# Patient Record
Sex: Female | Born: 1965 | Race: White | Hispanic: No | Marital: Married | Smoking: Never smoker
Health system: Southern US, Community
[De-identification: ages and names within clinical notes are randomized; demographics above are authoritative.]

## PROBLEM LIST (undated history)

## (undated) DIAGNOSIS — K760 Fatty (change of) liver, not elsewhere classified: Secondary | ICD-10-CM

## (undated) DIAGNOSIS — J302 Other seasonal allergic rhinitis: Secondary | ICD-10-CM

## (undated) DIAGNOSIS — R011 Cardiac murmur, unspecified: Secondary | ICD-10-CM

## (undated) DIAGNOSIS — D126 Benign neoplasm of colon, unspecified: Secondary | ICD-10-CM

## (undated) HISTORY — DX: Cardiac murmur, unspecified: R01.1

## (undated) HISTORY — DX: Fatty (change of) liver, not elsewhere classified: K76.0

## (undated) HISTORY — PX: WISDOM TOOTH EXTRACTION: SHX21

## (undated) HISTORY — DX: Benign neoplasm of colon, unspecified: D12.6

## (undated) HISTORY — PX: TONSILLECTOMY: SUR1361

## (undated) HISTORY — DX: Other seasonal allergic rhinitis: J30.2

---

## 1999-03-02 ENCOUNTER — Other Ambulatory Visit: Admission: RE | Admit: 1999-03-02 | Discharge: 1999-03-02 | Payer: Self-pay | Admitting: *Deleted

## 2000-04-11 ENCOUNTER — Other Ambulatory Visit: Admission: RE | Admit: 2000-04-11 | Discharge: 2000-04-11 | Payer: Self-pay | Admitting: Obstetrics and Gynecology

## 2004-06-04 ENCOUNTER — Other Ambulatory Visit: Admission: RE | Admit: 2004-06-04 | Discharge: 2004-06-04 | Payer: Self-pay | Admitting: Obstetrics and Gynecology

## 2011-01-13 ENCOUNTER — Ambulatory Visit: Payer: Self-pay | Admitting: Internal Medicine

## 2016-09-03 ENCOUNTER — Other Ambulatory Visit: Payer: Self-pay | Admitting: Nurse Practitioner

## 2016-09-03 DIAGNOSIS — R1011 Right upper quadrant pain: Secondary | ICD-10-CM

## 2016-09-07 ENCOUNTER — Ambulatory Visit: Payer: Self-pay

## 2016-09-09 ENCOUNTER — Ambulatory Visit
Admission: RE | Admit: 2016-09-09 | Discharge: 2016-09-09 | Disposition: A | Discharge status: Home / Self Care | Payer: BC Managed Care – PPO | Source: Ambulatory Visit | Attending: Nurse Practitioner | Admitting: Nurse Practitioner

## 2016-09-09 DIAGNOSIS — R1011 Right upper quadrant pain: Secondary | ICD-10-CM | POA: Insufficient documentation

## 2016-09-09 DIAGNOSIS — R932 Abnormal findings on diagnostic imaging of liver and biliary tract: Secondary | ICD-10-CM | POA: Diagnosis not present

## 2016-09-20 ENCOUNTER — Other Ambulatory Visit: Payer: Self-pay | Admitting: Nurse Practitioner

## 2016-09-20 DIAGNOSIS — R1011 Right upper quadrant pain: Secondary | ICD-10-CM

## 2016-09-21 ENCOUNTER — Ambulatory Visit
Admission: RE | Admit: 2016-09-21 | Discharge: 2016-09-21 | Disposition: A | Discharge status: Home / Self Care | Payer: BC Managed Care – PPO | Source: Ambulatory Visit | Attending: Nurse Practitioner | Admitting: Nurse Practitioner

## 2016-09-21 DIAGNOSIS — Z975 Presence of (intrauterine) contraceptive device: Secondary | ICD-10-CM | POA: Insufficient documentation

## 2016-09-21 DIAGNOSIS — K5730 Diverticulosis of large intestine without perforation or abscess without bleeding: Secondary | ICD-10-CM | POA: Diagnosis not present

## 2016-09-21 DIAGNOSIS — R1011 Right upper quadrant pain: Secondary | ICD-10-CM | POA: Diagnosis not present

## 2016-09-21 DIAGNOSIS — K760 Fatty (change of) liver, not elsewhere classified: Secondary | ICD-10-CM | POA: Diagnosis not present

## 2016-09-21 MED ORDER — IOPAMIDOL (ISOVUE-300) INJECTION 61%
100.0000 mL | Freq: Once | INTRAVENOUS | Status: AC | PRN
  Administered 2016-09-21: 100 mL via INTRAVENOUS

## 2016-09-23 ENCOUNTER — Other Ambulatory Visit: Payer: Self-pay | Admitting: Adult Health

## 2016-09-23 DIAGNOSIS — R1011 Right upper quadrant pain: Secondary | ICD-10-CM

## 2016-09-30 ENCOUNTER — Ambulatory Visit: Payer: BC Managed Care – PPO

## 2016-09-30 ENCOUNTER — Encounter (HOSPITAL_COMMUNITY)
Admission: RE | Admit: 2016-09-30 | Discharge: 2016-09-30 | Disposition: A | Discharge status: Home / Self Care | Payer: BC Managed Care – PPO | Source: Ambulatory Visit | Attending: Adult Health | Admitting: Adult Health

## 2016-09-30 DIAGNOSIS — R1011 Right upper quadrant pain: Secondary | ICD-10-CM | POA: Diagnosis present

## 2016-09-30 MED ORDER — TECHNETIUM TC 99M MEBROFENIN IV KIT
5.3200 | PACK | Freq: Once | INTRAVENOUS | Status: AC | PRN
  Administered 2016-09-30: 5.32 via INTRAVENOUS

## 2016-10-05 ENCOUNTER — Inpatient Hospital Stay: Payer: BC Managed Care – PPO | Attending: Internal Medicine | Admitting: Internal Medicine

## 2016-10-05 ENCOUNTER — Encounter (INDEPENDENT_AMBULATORY_CARE_PROVIDER_SITE_OTHER): Payer: Self-pay

## 2016-10-05 DIAGNOSIS — K760 Fatty (change of) liver, not elsewhere classified: Secondary | ICD-10-CM | POA: Insufficient documentation

## 2016-10-05 DIAGNOSIS — R011 Cardiac murmur, unspecified: Secondary | ICD-10-CM | POA: Insufficient documentation

## 2016-10-05 DIAGNOSIS — D803 Selective deficiency of immunoglobulin G [IgG] subclasses: Secondary | ICD-10-CM | POA: Insufficient documentation

## 2016-10-05 DIAGNOSIS — D122 Benign neoplasm of ascending colon: Secondary | ICD-10-CM | POA: Diagnosis not present

## 2016-10-05 DIAGNOSIS — Z86010 Personal history of colonic polyps: Secondary | ICD-10-CM | POA: Insufficient documentation

## 2016-10-06 NOTE — Progress Notes (Signed)
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 She recently underwent a screening colonoscopy approximately 6 weeks ago. At that time she was noted to have polyps that were removed and were reported to her as benign. The day after colonoscopy she began having right upper quadrant pain she reports the pain to be a dulll achiness  on a scale off 2-3 out of 10. She reported this to Dr. Vira Agar she was evaluated by NP [facility]. She had an ultrasound of the gallbladder and liver showed no biliary ductal dilatation, no gall stones, no suspicious lesions in the liver but there was fatty infiltration  Of liver. CT scan with contrast of the abdomen revealed no othrt abnormalities. Hepatobiliary scintigraphy was normal with normal gallbladder ejection fraction.  She was incidentally also noted to have a transient elevation of the ALT within normal AST and alkaline phosphatase and normal bilirubin. This prompted workup for hepatitis which included hepatitis C and B panel which were reported negative. A serum protein electrophoresis was also ordered which revealed no monoclonal protein however her serum IgG G level was slightly below normal and IgA level was also reported slightly below normal. Prompted referral to hematology. Patient reports no history of frequent infections has bronchitis urinary tract infections or diarrhea. She's been relatively healthy denies any skin rashes no history of jaundice. No history of alcoholism. Denies any bone pains. The right upper quadrant pain does appear to get worse with sharp movements. She denies any nausea or vomiting melena hematochezia or change in her  bowel habits since colonoscopy. She denies any weight loss. All other systems review is negative at this time.    Results review ( External and Internal):  On 09/20/2016 white cell count was 6.2 hemoglobin 14.2 platelets 289 BUN 18 creatinine 0.8 calcium was 10.7 AST 19 ALT was also 19 with a previous value was reported as 51 on 09/03/2016 albumin was 4.6 alkaline phosphatase 55 total protein was 7.1 total bili was 0.8 a by G ratio was normal at 1.8 lipase was normal beta-hCG was negative C-reactive protein was normal at 2.5 sedimentation rate was 15 random urine electrophoresis was normal and serum A versus showed a total IgG at 675 on 09/13/2016 it was 758 on 09/03/2016 IgM was 227 slightly about normal on 09/13/2016. IgA level was normal at 165. The M spike was not detected IgE levels were normal. Hepatitis C antibody negative hepatitis B surface antigen negative hepatitis a IgM and total antibody was negative hepatitis B core antibody was negative. Serum iron was 110 total iron-binding capacity was 420 was 300 saturation was 25%      LAB RESULTS:  No results found for: NA, K, CL, CO2, GLUCOSE, BUN, CREATININE, CALCIUM, PROT, ALBUMIN, AST, ALT, ALKPHOS, BILITOT, GFRNONAA, GFRAA  No results found for: WBC, NEUTROABS, HGB, HCT, MCV, PLT   STUDIES: US Abdomen Complete  Result Date: 09/09/2016 CLINICAL DATA:  Right upper quadrant pain . EXAM: ABDOMEN ULTRASOUND COMPLETE COMPARISON:  No prior . FINDINGS: Gallbladder: No gallstones or wall thickening visualized. No sonographic Murphy sign noted by sonographer. Common bile duct: Diameter: 4 mm Liver: Liver is echogenic consistent fatty infiltration and/or hepatocellular disease. No focal hepatic abnormality  identified. IVC: No abnormality visualized. Pancreas: Visualized portion unremarkable. Spleen: Size and appearance within normal limits. Right Kidney: Length: 10.9 cm. Echogenicity within normal limits. No mass or hydronephrosis visualized.  Left Kidney: Length: 10.6 cm. Echogenicity within normal limits. No mass or hydronephrosis visualized. Abdominal aorta: No aneurysm visualized. Other findings: None. IMPRESSION: 1. Liver is echogenic consistent fatty infiltration and/or hepatocellular disease. No focal hepatic abnormality identified. 2. Exam otherwise unremarkable. Electronically Signed   By: Marcello Moores  Register   On: 09/09/2016 12:41   Ct Abdomen Pelvis W Contrast  Result Date: 09/21/2016 CLINICAL DATA:  Right abdominal pain for 1 month falling colonoscopy. Endometriosis. IUD. EXAM: CT ABDOMEN AND PELVIS WITH CONTRAST TECHNIQUE: Multidetector CT imaging of the abdomen and pelvis was performed using the standard protocol following bolus administration of intravenous contrast. CONTRAST:  179mL ISOVUE-300 IOPAMIDOL (ISOVUE-300) INJECTION 61% COMPARISON:  None. FINDINGS: Lower Chest: No acute findings. Hepatobiliary: No mass identified. Mild hepatic steatosis. Gallbladder is unremarkable. Pancreas:  No mass or inflammatory changes. Spleen: Within normal limits in size and appearance. Adrenals/Urinary Tract: No masses identified. No evidence of hydronephrosis. Stomach/Bowel: No evidence of obstruction, inflammatory process or abnormal fluid collections. No evidence of abnormal bowel wall thickening or free intraperitoneal air. Normal appendix visualized. Diverticulosis is seen involving the sigmoid colon, however there is no evidence of diverticulitis. Vascular/Lymphatic: No pathologically enlarged lymph nodes. No abdominal aortic aneurysm. Reproductive: IUD seen in expected location in the uterus. Uterus and adnexal regions are otherwise unremarkable. No mass, inflammatory process, or abnormal fluid collections identified. Other:  None. Musculoskeletal:  No suspicious bone lesions identified. IMPRESSION: No acute findings within the abdomen or pelvis. Mild hepatic steatosis. Colonic diverticulosis. No radiographic evidence of diverticulitis. IUD in  appropriate position. Electronically Signed   By: Earle Gell M.D.   On: 09/21/2016 11:57   Nm Hepato W/eject Fract  Result Date: 09/30/2016 CLINICAL DATA:  50 y/o F; right upper quadrant pain since colonoscopy 08/19/2016. EXAM: NUCLEAR MEDICINE HEPATOBILIARY IMAGING WITH GALLBLADDER EF TECHNIQUE: Sequential images of the abdomen were obtained out to 60 minutes following intravenous administration of radiopharmaceutical. After oral ingestion of Ensure, gallbladder ejection fraction was determined. At 60 min, normal ejection fraction is greater than 33%. RADIOPHARMACEUTICALS:  5.32 mCi Tc-98m  Choletec IV COMPARISON:  09/21/2016 CT abdomen and pelvis and 09/09/2016 abdominal sonogram. FINDINGS: Prompt uptake and biliary excretion of activity by the liver is seen. Gallbladder activity is visualized, consistent with patency of cystic duct. Biliary activity passes into small bowel, consistent with patent common bile duct. Calculated gallbladder ejection fraction is 63%. (Normal gallbladder ejection fraction with Ensure is greater than 33%.) Patient reports slight pain after consumption of Ensure. IMPRESSION: Normal hepatobiliary scintigraphy and normal gallbladder ejection fraction. Electronically Signed   By: Kristine Garbe M.D.   On: 09/30/2016 13:39     REVIEW OF SYSTEMS:   Review of Systems  Constitutional: Negative.   HENT: Negative.   Eyes: Negative.   Cardiovascular: Negative.   Gastrointestinal: Positive for abdominal pain.  Genitourinary: Negative.   Musculoskeletal: Negative.   Skin: Negative.   Neurological: Negative.   Endo/Heme/Allergies: Negative.   Psychiatric/Behavioral: Negative.   All other systems reviewed and are negative.   As per HPI. Otherwise, a complete review of systems is negative.  PAST MEDICAL HISTORY: Past Medical History:  Diagnosis Date  . Fatty liver   . Heart murmur   . Seasonal allergies     PAST SURGICAL HISTORY: History reviewed. No  pertinent surgical history.  FAMILY HISTORY:  Family History  Problem Relation Age of Onset  . Cancer Maternal Grandmother   . Cancer Paternal Grandmother   . Cancer Paternal Grandfather     No Known Allergies  Vitals:   10/05/16 1547  BP: 135/81  Pulse: 78  Temp: (!) 94.5 F (34.7 C)     There is no height or weight on file to calculate BMI.   There is no height or weight on file to calculate BSA.    Physical Exam  Constitutional: She appears well-developed and well-nourished.  HENT:  Head: Normocephalic and atraumatic.  Eyes: EOM are normal. Pupils are equal, round, and reactive to light.  Neck: Normal range of motion. Neck supple.  Cardiovascular: Normal rate and regular rhythm.   Pulmonary/Chest: Effort normal.  Abdominal: Soft. Bowel sounds are normal. There is tenderness (Tenderness elicited on deep palpation in the right upper quadrant but no palpable masses or hepatomegaly or splenomegaly).  Lymphadenopathy:    She has no cervical adenopathy.  Nursing note and vitals reviewed.   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 This is clinically of no significance to her since this abnormality  is the not contributing to any frequent infections etc. She does have some fatty infiltration of the liver that can explain mild elevation of ALT. There is no monoclonal gammopathy. No hematologic or oncologic intervention is needed at this time.  With regard to the persistent right upper quadrant pain, it clearly appears to have begun the day after colonoscopy  One of the polyps that was removed was in the proximal ascending colon. I reviewed his CT scans ultrasound ports there appears to be no signs of perforation diverticulitis or intra-abdominal bleeding. She will contact her gastroenterologist for further workup. She may benefit from a surgical consultation. No return visit here is necessary.     Patient expressed understanding and was in  agreement with this plan. She also understands that She can call clinic at any time with any questions, concerns, or complaints.       This note was generated in part with voice recognition software and I apologize for any typographical errors that were not detected and corrected.    Creola Corn, MD   10/06/2016 1:49 PM

## 2016-10-11 ENCOUNTER — Telehealth: Payer: Self-pay | Admitting: Gastroenterology

## 2016-10-11 NOTE — Telephone Encounter (Signed)
Patient is requesting to see Dr. Fuller Plan for 2nd opinion on RUQ pain. Records placed on Dr. Lynne Leader desk for review.

## 2016-10-19 ENCOUNTER — Encounter: Payer: Self-pay | Admitting: Gastroenterology

## 2016-10-19 NOTE — Telephone Encounter (Signed)
Dr. Fuller Plan reviewed records and has accepted patient. Ok to schedule OV. Left message for patient to return my call.

## 2016-12-07 ENCOUNTER — Ambulatory Visit (INDEPENDENT_AMBULATORY_CARE_PROVIDER_SITE_OTHER): Payer: BC Managed Care – PPO | Admitting: Gastroenterology

## 2016-12-07 ENCOUNTER — Encounter (INDEPENDENT_AMBULATORY_CARE_PROVIDER_SITE_OTHER): Payer: Self-pay

## 2016-12-07 ENCOUNTER — Encounter: Payer: Self-pay | Admitting: Gastroenterology

## 2016-12-07 VITALS — BP 120/80 | HR 68 | Ht 61.0 in | Wt 194.8 lb

## 2016-12-07 DIAGNOSIS — K760 Fatty (change of) liver, not elsewhere classified: Secondary | ICD-10-CM

## 2016-12-07 DIAGNOSIS — R1011 Right upper quadrant pain: Secondary | ICD-10-CM

## 2016-12-07 DIAGNOSIS — R0781 Pleurodynia: Secondary | ICD-10-CM | POA: Diagnosis not present

## 2016-12-07 DIAGNOSIS — Z86010 Personal history of colonic polyps: Secondary | ICD-10-CM

## 2016-12-07 NOTE — Patient Instructions (Addendum)
Take Tylenol and Advil as directed on packaging x 2-3 weeks.  Call back if your symptoms persist.     Thank you for choosing me and [facility].  Pricilla Riffle. Dagoberto Ligas., MD., Marval Regal

## 2016-12-07 NOTE — Progress Notes (Signed)
History of Present Illness: This is a 51 year old female referred by Maryland Pink, MD for the evaluation of RUQ pain/right costal margin pain. Pt recently evaluated by Dr. Vira Agar. I received and reviewed records. Colonoscopy for routine CRC screening in 07/2016 showed 2 small polyps (adenomatous by patient report). Following her colonoscopy she developed focal right costal margin/right upper quadrant pain that occasionally radiates to her back. Her symptoms are exacerbated by sitting and improve with standing. Her symptoms do not change with meals or bowel movements. She has mild reflux symptoms which are improved with omeprazole. Dicyclomine was tried with no impact on her right sided pain. Imaging studies outlined below. Denies weight loss, constipation, diarrhea, change in stool caliber, melena, hematochezia, nausea, vomiting, dysphagia.   Abd/pelvic CT 08/2016 fatty liver, colonic diverticulosis, IUD.  Abd [place] 08/2016 fatty liver.  HIDA 63% EF, normal  No Known Allergies Outpatient Medications Prior to Visit  Medication Sig Dispense Refill  . Omeprazole 20 MG TBEC Take 20 mg by mouth daily.    Marland Kitchen dicyclomine (BENTYL) 10 MG capsule Take by mouth.     No facility-administered medications prior to visit.    Past Medical History:  Diagnosis Date  . Adenomatous colon polyp 07/2021  . Fatty liver   . Heart murmur   . Seasonal allergies    Past Surgical History:  Procedure Laterality Date  . CESAREAN SECTION    . TONSILLECTOMY    . WISDOM TOOTH EXTRACTION     Social History   Social History  . Marital status: Married    Spouse name: N/A  . Number of children: N/A  . Years of education: N/A   Social History Main Topics  . Smoking status: Never Smoker  . Smokeless tobacco: Never Used  . Alcohol use 1.8 oz/week    3 Cans of beer per week     Comment: social drinker  . Drug use: No  . Sexual activity: Yes   Other Topics Concern  . None   Social History Narrative  . None    Family History  Problem Relation Age of Onset  . Cancer Maternal Grandmother   . Heart attack Maternal Grandmother   . Thyroid disease Maternal Grandmother   . Cancer Paternal Grandmother   . Cancer Paternal Grandfather   . Colon polyps Sister   . Diabetes type II Maternal Grandfather       Review of Systems: Pertinent positive and negative review of systems were noted in the above HPI section. All other review of systems were otherwise negative.    Physical Exam: General: Well developed, well nourished, no acute distress Head: Normocephalic and atraumatic Eyes:  sclerae anicteric, EOMI Ears: Normal auditory acuity Mouth: No deformity or lesions Neck: Supple, no masses or thyromegaly Lungs: Clear throughout to auscultation Heart: Regular rate and rhythm; no murmurs, rubs or bruits Chest: focal tenderness on right costal margin Abdomen: Soft, mild focal RUQ tenderness and non distended. No masses, hepatosplenomegaly or hernias noted. Normal Bowel sounds Rectal: deferred Musculoskeletal: Symmetrical with no gross deformities  Skin: No lesions on visible extremities Pulses:  Normal pulses noted Extremities: No clubbing, cyanosis, edema or deformities noted Neurological: Alert oriented x 4, grossly nonfocal Cervical Nodes:  No significant cervical adenopathy Inguinal Nodes: No significant inguinal adenopathy Psychological:  Alert and cooperative. Normal mood and affect  Assessment and Recommendations:  1. Right costal margin/RUQ pain, appears to be musculoskeletal. Pain occasionally radiates to her back. Patient was advised to try Tylenol  1-2 tid and Advil 400-600 mg tid for the next 2-3 weeks on a regular basis for symptom management and if symptoms do not improve to seek further care from her primary physician. Consider EGD if symptoms remain refractory although I feel her symptoms are not due to a gastrointestinal cause and EGD would be low yield.  2. Personal history of  adenomatous colon polyps. Five-year interval colonoscopy is recommended in September 2022.  3. Fatty liver. Long-term low fat, carb modified. weight loss diet supervised by PCP.   4. Mild GERD. Follow standard antireflux measures and continue omeprazole 20 mg daily as needed.  cc: Maryland Pink, MD [address] [place], [place] [address]

## 2017-03-10 ENCOUNTER — Other Ambulatory Visit: Payer: Self-pay | Admitting: Orthopedic Surgery

## 2017-03-10 DIAGNOSIS — M25361 Other instability, right knee: Secondary | ICD-10-CM

## 2017-03-10 DIAGNOSIS — M2391 Unspecified internal derangement of right knee: Secondary | ICD-10-CM

## 2017-03-10 DIAGNOSIS — M25561 Pain in right knee: Secondary | ICD-10-CM

## 2017-03-21 ENCOUNTER — Ambulatory Visit
Admission: RE | Admit: 2017-03-21 | Discharge: 2017-03-21 | Disposition: A | Discharge status: Home / Self Care | Payer: BC Managed Care – PPO | Source: Ambulatory Visit | Attending: Orthopedic Surgery | Admitting: Orthopedic Surgery

## 2017-03-21 DIAGNOSIS — M25561 Pain in right knee: Secondary | ICD-10-CM | POA: Insufficient documentation

## 2017-03-21 DIAGNOSIS — M2241 Chondromalacia patellae, right knee: Secondary | ICD-10-CM | POA: Diagnosis not present

## 2017-03-21 DIAGNOSIS — R609 Edema, unspecified: Secondary | ICD-10-CM | POA: Insufficient documentation

## 2017-03-21 DIAGNOSIS — M7121 Synovial cyst of popliteal space [Baker], right knee: Secondary | ICD-10-CM | POA: Diagnosis not present

## 2017-03-21 DIAGNOSIS — M25361 Other instability, right knee: Secondary | ICD-10-CM

## 2017-03-21 DIAGNOSIS — M2391 Unspecified internal derangement of right knee: Secondary | ICD-10-CM | POA: Insufficient documentation

## 2017-05-23 ENCOUNTER — Other Ambulatory Visit: Payer: Self-pay | Admitting: Family Medicine

## 2017-05-23 DIAGNOSIS — R1084 Generalized abdominal pain: Secondary | ICD-10-CM

## 2017-05-26 ENCOUNTER — Ambulatory Visit
Admission: RE | Admit: 2017-05-26 | Discharge: 2017-05-26 | Disposition: A | Discharge status: Home / Self Care | Payer: BC Managed Care – PPO | Source: Ambulatory Visit | Attending: Family Medicine | Admitting: Family Medicine

## 2017-05-26 DIAGNOSIS — R1084 Generalized abdominal pain: Secondary | ICD-10-CM | POA: Diagnosis present

## 2017-05-26 DIAGNOSIS — R935 Abnormal findings on diagnostic imaging of other abdominal regions, including retroperitoneum: Secondary | ICD-10-CM | POA: Insufficient documentation

## 2017-10-18 ENCOUNTER — Other Ambulatory Visit: Payer: Self-pay | Admitting: Nurse Practitioner

## 2017-10-18 DIAGNOSIS — R1011 Right upper quadrant pain: Secondary | ICD-10-CM

## 2017-10-18 DIAGNOSIS — K760 Fatty (change of) liver, not elsewhere classified: Secondary | ICD-10-CM

## 2017-10-22 ENCOUNTER — Ambulatory Visit: Payer: BC Managed Care – PPO

## 2017-10-26 ENCOUNTER — Ambulatory Visit: Payer: BC Managed Care – PPO

## 2018-12-07 ENCOUNTER — Encounter: Payer: Self-pay | Admitting: Obstetrics & Gynecology

## 2019-01-08 ENCOUNTER — Encounter: Payer: BC Managed Care – PPO | Admitting: Obstetrics & Gynecology

## 2019-01-30 ENCOUNTER — Other Ambulatory Visit: Payer: Self-pay

## 2019-01-30 ENCOUNTER — Other Ambulatory Visit (HOSPITAL_COMMUNITY)
Admission: RE | Admit: 2019-01-30 | Discharge: 2019-01-30 | Disposition: A | Discharge status: Home / Self Care | Payer: BC Managed Care – PPO | Source: Ambulatory Visit | Attending: Obstetrics & Gynecology | Admitting: Obstetrics & Gynecology

## 2019-01-30 ENCOUNTER — Encounter: Payer: Self-pay | Admitting: Obstetrics & Gynecology

## 2019-01-30 ENCOUNTER — Ambulatory Visit: Payer: BC Managed Care – PPO | Admitting: Obstetrics & Gynecology

## 2019-01-30 VITALS — BP 138/88 | HR 64 | Resp 16 | Ht 61.0 in | Wt 198.0 lb

## 2019-01-30 DIAGNOSIS — Z0000 Encounter for general adult medical examination without abnormal findings: Secondary | ICD-10-CM | POA: Diagnosis not present

## 2019-01-30 DIAGNOSIS — Z124 Encounter for screening for malignant neoplasm of cervix: Secondary | ICD-10-CM

## 2019-01-30 DIAGNOSIS — N912 Amenorrhea, unspecified: Secondary | ICD-10-CM

## 2019-01-30 DIAGNOSIS — Z01419 Encounter for gynecological examination (general) (routine) without abnormal findings: Secondary | ICD-10-CM | POA: Diagnosis not present

## 2019-01-30 NOTE — Progress Notes (Signed)
53 y.o. [id number] Married White or Caucasian female here for new patient annual exam.  Has a Mirena IUD placed around age 29 by [facility] in [place] by Dr. Fredonia Highland.  Has not bled in many years.  Does have occasional mild hot flashes.    Had colonoscopy 9/17 with two adenomatous polyps in 2017.  Since colonoscopy has experienced RUQ pain.  Has done thorough evaluation including blood work and imaging.    PCP:  Dr. Kary Kos.  Patient's last menstrual period was 11/23/2011 (approximate).          Sexually active: Yes.    The current method of family planning is none.    Exercising: Yes.    walking, elliptical  Smoker:  no  Health Maintenance: Pap: 07/07/16 Normal  History of abnormal Pap:  no MMG:  04/2018 Normal. Has appt scheduled 05/24/19 at [facility] Colonoscopy:  08/19/2016 adenomatous polyps. F/u 5 years  BMD:  Years ago  TDaP:  Unsure of last one Pneumonia vaccine(s):  n/a Shingrix:   No Hep C testing: 09/03/16 neg  Screening Labs: PCP   reports that she has never smoked. She has never used smokeless tobacco. She reports current alcohol use of about 5.0 - 7.0 standard drinks of alcohol per week. She reports that she does not use drugs.  Past Medical History:  Diagnosis Date  . Adenomatous colon polyp 07/2021  . Fatty liver   . Heart murmur   . Seasonal allergies     Past Surgical History:  Procedure Laterality Date  . CESAREAN SECTION    . TONSILLECTOMY    . WISDOM TOOTH EXTRACTION      Current Outpatient Medications  Medication Sig Dispense Refill  . Omega-3 Fatty Acids (FISH OIL) 1000 MG CAPS Take by mouth.     No current facility-administered medications for this visit.     Family History  Problem Relation Age of Onset  . Heart attack Maternal Grandmother   . Thyroid disease Maternal Grandmother   . Non-Hodgkin's lymphoma Maternal Grandmother   . Lung cancer Maternal Grandmother   . Lung cancer Paternal Grandmother   . Cancer Paternal Grandfather   . Colon  polyps Sister   . Diabetes type II Maternal Grandfather   . Diabetes Mother     Review of Systems  All other systems reviewed and are negative.   Exam:   BP 138/88 (BP Location: Right Arm, Patient Position: Sitting, Cuff Size: Large)   Pulse 64   Resp 16   Ht 5\' 1"  (1.549 m)   Wt 198 lb (89.8 kg)   LMP 11/23/2011 (Approximate)   BMI 37.41 kg/m   Height: 5\' 1"  (154.9 cm)  Ht Readings from Last 3 Encounters:  01/30/19 5\' 1"  (1.549 m)  12/07/16 5\' 1"  (1.549 m)   General appearance: alert, cooperative and appears stated age Head: Normocephalic, without obvious abnormality, atraumatic Neck: no adenopathy, supple, symmetrical, trachea midline and thyroid normal to inspection and palpation Lungs: clear to auscultation bilaterally Breasts: normal appearance, no masses or tenderness Heart: regular rate and rhythm Abdomen: soft, non-tender; bowel sounds normal; no masses,  no organomegaly Extremities: extremities normal, atraumatic, no cyanosis or edema Skin: Skin color, texture, turgor normal. No rashes or lesions Lymph nodes: Cervical, supraclavicular, and axillary nodes normal. No abnormal inguinal nodes palpated Neurologic: Grossly normal   Pelvic: External genitalia:  no lesions              Urethra:  normal appearing urethra with no masses, tenderness or  lesions              Bartholins and Skenes: normal                 Vagina: normal appearing vagina with normal color and discharge, no lesions              Cervix: no lesions              Pap taken: Yes.   Bimanual Exam:  Uterus:  normal size, contour, position, consistency, mobility, non-tender              Adnexa: normal adnexa and no mass, fullness, tenderness               Rectovaginal: Confirms               Anus:  normal sphincter tone, no lesions  Chaperone was present for exam.  A:  Well Woman with normal exam Lapse of gyn care Amenorrhea most likely due to menopause Pt declines having IUD removed today H/o  fatty liver disease  P:   Mammogram 2019.  Does have 2020 already scheduled pap smear and HR HPV obtained today [place] level obtained today.  If elevated (and it likely is) she will return for IUD removal. Not interested in HRT at this time.  Colonoscopy UTD.  Follow up due 5 years. Shingrix vaccine discussed return annually or prn

## 2019-01-31 LAB — FOLLICLE STIMULATING HORMONE: FSH: 62.7 m[IU]/mL

## 2019-01-31 LAB — VITAMIN D 25 HYDROXY (VIT D DEFICIENCY, FRACTURES): VIT D 25 HYDROXY: 23.4 ng/mL — AB (ref 30.0–100.0)

## 2019-01-31 LAB — TSH: TSH: 1.53 u[IU]/mL (ref 0.450–4.500)

## 2019-02-01 LAB — CYTOLOGY - PAP
Diagnosis: NEGATIVE
HPV (WINDOPATH): NOT DETECTED

## 2019-03-20 ENCOUNTER — Emergency Department
Admission: EM | Admit: 2019-03-20 | Discharge: 2019-03-20 | Disposition: A | Discharge status: Home / Self Care | Payer: BC Managed Care – PPO | Attending: Emergency Medicine | Admitting: Emergency Medicine

## 2019-03-20 ENCOUNTER — Encounter: Payer: Self-pay | Admitting: Emergency Medicine

## 2019-03-20 ENCOUNTER — Emergency Department: Payer: BC Managed Care – PPO

## 2019-03-20 ENCOUNTER — Other Ambulatory Visit: Payer: Self-pay

## 2019-03-20 DIAGNOSIS — N390 Urinary tract infection, site not specified: Secondary | ICD-10-CM | POA: Insufficient documentation

## 2019-03-20 DIAGNOSIS — R1011 Right upper quadrant pain: Secondary | ICD-10-CM | POA: Diagnosis present

## 2019-03-20 DIAGNOSIS — Z79899 Other long term (current) drug therapy: Secondary | ICD-10-CM | POA: Insufficient documentation

## 2019-03-20 LAB — URINALYSIS, COMPLETE (UACMP) WITH MICROSCOPIC
Bacteria, UA: NONE SEEN
Bilirubin Urine: NEGATIVE
Glucose, UA: NEGATIVE mg/dL
Ketones, ur: NEGATIVE mg/dL
Nitrite: NEGATIVE
Protein, ur: NEGATIVE mg/dL
Specific Gravity, Urine: 1.025 (ref 1.005–1.030)
WBC, UA: >50 WBC/hpf — ABNORMAL HIGH (ref 0–5)
pH: 5 (ref 5.0–8.0)

## 2019-03-20 LAB — CBC
HCT: 42 % (ref 36.0–46.0)
Hemoglobin: 14.1 g/dL (ref 12.0–15.0)
MCH: 29.9 pg (ref 26.0–34.0)
MCHC: 33.6 g/dL (ref 30.0–36.0)
MCV: 89 fL (ref 80.0–100.0)
Platelets: 235 10*3/uL (ref 150–400)
RBC: 4.72 MIL/uL (ref 3.87–5.11)
RDW: 13 % (ref 11.5–15.5)
WBC: 5.7 10*3/uL (ref 4.0–10.5)
nRBC: 0 % (ref 0.0–0.2)

## 2019-03-20 LAB — COMPREHENSIVE METABOLIC PANEL
ALT: 22 U/L (ref 0–44)
AST: 21 U/L (ref 15–41)
Albumin: 4.3 g/dL (ref 3.5–5.0)
Alkaline Phosphatase: 63 U/L (ref 38–126)
Anion gap: 7 (ref 5–15)
BUN: 15 mg/dL (ref 6–20)
CO2: 25 mmol/L (ref 22–32)
Calcium: 9.1 mg/dL (ref 8.9–10.3)
Chloride: 108 mmol/L (ref 98–111)
Creatinine, Ser: 0.62 mg/dL (ref 0.44–1.00)
GFR calc Af Amer: >60 mL/min (ref >60)
GFR calc non Af Amer: >60 mL/min (ref >60)
Glucose, Bld: 105 mg/dL — ABNORMAL HIGH (ref 70–99)
Potassium: 3.5 mmol/L (ref 3.5–5.1)
Sodium: 140 mmol/L (ref 135–145)
Total Bilirubin: 1.4 mg/dL — ABNORMAL HIGH (ref 0.3–1.2)
Total Protein: 6.8 g/dL (ref 6.5–8.1)

## 2019-03-20 LAB — LIPASE, BLOOD: Lipase: 30 U/L (ref 11–51)

## 2019-03-20 MED ORDER — CEPHALEXIN 500 MG PO CAPS: 500.0000 mg | ORAL_CAPSULE | Freq: Two times a day (BID) | ORAL | 0 refills | Status: AC

## 2019-03-20 MED ORDER — SODIUM CHLORIDE 0.9% FLUSH: 3.0000 mL | Freq: Once | INTRAVENOUS | Status: DC

## 2019-03-20 NOTE — Discharge Instructions (Signed)
Take the antibiotic as prescribed and finish the full course.  Contact your doctor to schedule a follow-up appointment and any further work-up as needed.  Return to the ER for new or worsening pain, fever, persistent vomiting, weakness, or any other new or worsening symptoms that concern you.

## 2019-03-20 NOTE — ED Triage Notes (Signed)
Pt states she began vomiting last pm "vilolently." Has been having RUQ pain for about 3 years now-worsening over the past few days. "feels like there is a rock inside of it." NAD. Denies vomiting today. Pt states pain worsens after eating.

## 2019-03-20 NOTE — ED Notes (Signed)
E-signature pad not available. Pt understands and accepts discharge instructions. NAD noted.

## 2019-03-20 NOTE — ED Notes (Signed)
Pt discharged home after verbalizing understanding of discharge instructions; nad noted. 

## 2019-03-20 NOTE — ED Notes (Signed)
Pt reports chronic RUQ pain x 3 years, states it started with colonoscopy. Sates she has had tests but nothing has been diagnosed. She reports 6-7 episodes of emesis last night, and then the pain intensified. Pain described as pinching and burning, 6/10. NAD noted.

## 2019-03-20 NOTE — ED Provider Notes (Signed)
[facility] Emergency Department Provider Note ____________________________________________   First MD Initiated Contact with Patient 03/20/19 1506     (approximate)  I have reviewed the triage vital signs and the nursing notes.   HISTORY  Chief Complaint Abdominal Pain    HPI Michelle Vance is a 53 y.o. female with PMH as noted below who presents with right upper quadrant pain, present chronically for a few years but acutely exacerbated since last night not long after she ate a chicken pot pie.  The patient states that she had several episodes of vomiting at that time as well.  She denies associated fever, chest pain, change in bowel movements, shortness of breath.  She states that her urine has been slightly dark but denies other urinary symptoms.  Patient states that she has been worked up for the right upper quadrant pain in the past although her last ultrasound was a few years ago.   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 Authorizing Provider  cephALEXin (KEFLEX) 500 MG capsule Take 1 capsule (500 mg total) by mouth 2 (two) times daily for 7 days. 03/20/19 03/27/19  Arta Silence, MD  Omega-3 Fatty Acids (FISH OIL) 1000 MG CAPS Take by mouth.    [provider]    Allergies Patient has no known allergies.  Family History  Problem Relation Age of Onset   Heart attack Maternal Grandmother    Thyroid disease Maternal Grandmother    Non-Hodgkin's lymphoma Maternal Grandmother     Lung cancer Maternal Grandmother    Lung cancer Paternal Grandmother    Cancer Paternal Grandfather    Colon polyps Sister    Diabetes type II Maternal Grandfather    Diabetes Mother     Social History Social History   Tobacco Use   Smoking status: Never Smoker   Smokeless tobacco: Never Used  Substance Use Topics   Alcohol use: Yes    Alcohol/week: 5.0 - 7.0 standard drinks    Types: 5 - 7 Standard drinks or equivalent per week   Drug use: No    Review of Systems  Constitutional: No fever. Eyes: No redness. ENT: No sore throat. Cardiovascular: Denies chest pain. Respiratory: Denies shortness of breath. Gastrointestinal: Positive for resolved vomiting.  No diarrhea. Genitourinary: Negative for dysuria.  Musculoskeletal: Negative for back pain. Skin: Negative for rash. Neurological: Negative for headache.   ____________________________________________   PHYSICAL EXAM:  VITAL SIGNS: ED Triage Vitals  Enc Vitals Group     BP 03/20/19 1432 129/67     Pulse Rate 03/20/19 1432 71     Resp 03/20/19 1432 18     Temp 03/20/19 1432 98.3 F (36.8 C)     Temp Source 03/20/19 1432 Oral     SpO2 03/20/19 1432 98 %     Weight 03/20/19 1433 194 lb (88 kg)     Height 03/20/19 1433 5\' 1"  (1.549 m)  Head Circumference --      Peak Flow --      Pain Score 03/20/19 1433 4     Pain Loc --      Pain Edu? --      Excl. in [place]? --     Constitutional: Alert and oriented. Well appearing and in no acute distress. Eyes: Conjunctivae are normal.  Head: Atraumatic. Nose: No congestion/rhinnorhea. Mouth/Throat: Mucous membranes are moist.   Neck: Normal range of motion.  Cardiovascular: Normal rate, regular rhythm. Good peripheral circulation. Respiratory: Normal respiratory effort.  No retractions. Gastrointestinal: Mild right upper quadrant tenderness.  No distention.  Genitourinary: No flank tenderness. Musculoskeletal: Extremities warm and well perfused.    Neurologic:  Normal speech and language. No gross focal neurologic deficits are appreciated.  Skin:  Skin is warm and dry. No rash noted. Psychiatric: Mood and affect are normal. Speech and behavior are normal.  ____________________________________________   LABS (all labs ordered are listed, but only abnormal results are displayed)  Labs Reviewed  COMPREHENSIVE METABOLIC PANEL - Abnormal; Notable for the following components:      Result Value   Glucose, Bld 105 (*)    Total Bilirubin 1.4 (*)    All other components within normal limits  URINALYSIS, COMPLETE (UACMP) WITH MICROSCOPIC - Abnormal; Notable for the following components:   Color, Urine YELLOW (*)    APPearance CLOUDY (*)    Hgb urine dipstick SMALL (*)    Leukocytes,Ua LARGE (*)    WBC, UA >50 (*)    Non Squamous Epithelial PRESENT (*)    All other components within normal limits  LIPASE, BLOOD  CBC   ____________________________________________  EKG   ____________________________________________  RADIOLOGY  US abdomen RUQ: No gallstones or other acute abnormality  ____________________________________________   PROCEDURES  Procedure(s) performed: No  Procedures  Critical Care performed: No ____________________________________________   INITIAL IMPRESSION / ASSESSMENT AND PLAN / ED COURSE  Pertinent labs & imaging results that were available during my care of the patient were reviewed by me and considered in my medical decision making (see chart for details).  53 year old female presents with right upper quadrant abdominal pain and vomiting, mainly last night and now mostly resolved although she still has some pain.  The patient states she has had pain intermittently in the right upper quadrant for a few years and has had some imaging in the past.  I reviewed the past medical records in Epic.  The patient most recently had a right upper quadrant ultrasound in 2018 which showed fatty infiltration  in the liver but no gallstones or other acute abnormalities.  On exam, the patient is well-appearing.  Her vital signs are normal.  The abdomen is soft with mild right upper quadrant tenderness.  Initial lab work-up obtained from triage is all within normal limits except for UA showing significant WBCs and RBCs.  It is possible that the patient is having an atypical presentation of UTI/pyelonephritis, however given that the pain occurred after eating and is localized to the right upper quadrant we will obtain an ultrasound.  If this is negative anticipate discharge home with treatment for UTI.  ----------------------------------------- 5:46 PM on 03/20/2019 -----------------------------------------  Ultrasound shows no concerning acute findings.  Since the patient has minimal pain and no concerning lab abnormalities, as well as the chronic nature of the pain, there is no indication for further imaging in the emergency department.  She is stable for discharge home.  I will treat her for urinary  tract infection.  As the patient has no CVA tenderness, fever, or evidence of sepsis she does not require prolonged treatment for pyelonephritis.  I counseled the patient on the results of the work-up.  Return precautions given, and she expresses understanding. ____________________________________________   FINAL CLINICAL IMPRESSION(S) / ED DIAGNOSES  Final diagnoses:  Right upper quadrant abdominal pain  Urinary tract infection without hematuria, site unspecified      NEW MEDICATIONS STARTED DURING THIS VISIT:  New Prescriptions   CEPHALEXIN (KEFLEX) 500 MG CAPSULE    Take 1 capsule (500 mg total) by mouth 2 (two) times daily for 7 days.     Note:  This document was prepared using Dragon voice recognition software and may include unintentional dictation errors.    Arta Silence, MD 03/20/19 1747

## 2019-05-03 ENCOUNTER — Encounter: Payer: BC Managed Care – PPO | Admitting: Obstetrics & Gynecology

## 2019-05-29 ENCOUNTER — Encounter: Payer: Self-pay | Admitting: Obstetrics & Gynecology

## 2020-01-20 ENCOUNTER — Ambulatory Visit: Payer: BC Managed Care – PPO | Attending: Internal Medicine

## 2020-01-20 DIAGNOSIS — Z23 Encounter for immunization: Secondary | ICD-10-CM | POA: Insufficient documentation

## 2020-01-20 NOTE — Progress Notes (Signed)
   Covid-19 Vaccination Clinic  Name:  Michelle Vance    MRN: [id number] DOB: May 21, 1966  01/20/2020  Michelle Vance was observed post Covid-19 immunization for 15 minutes without incidence. She was provided with Vaccine Information Sheet and instruction to access the V-Safe system.   Michelle Vance was instructed to call 911 with any severe reactions post vaccine: Marland Kitchen Difficulty breathing  . Swelling of your face and throat  . A fast heartbeat  . A bad rash all over your body  . Dizziness and weakness    Immunizations Administered    Name Date Dose VIS Date Route   Pfizer COVID-19 Vaccine 01/20/2020  4:03 PM 0.3 mL 11/02/2019 Intramuscular   Manufacturer: Guadalupe Guerra   Lot: [id number]   [place]: [id number]

## 2020-02-13 ENCOUNTER — Ambulatory Visit: Payer: BC Managed Care – PPO | Attending: Internal Medicine

## 2020-02-13 DIAGNOSIS — Z23 Encounter for immunization: Secondary | ICD-10-CM

## 2020-02-13 NOTE — Progress Notes (Signed)
   Covid-19 Vaccination Clinic  Name:  Michelle Vance    MRN: [id number] DOB: 07-Dec-1965  02/13/2020  [name] was observed post Covid-19 immunization for 15 minutes without incident. She was provided with Vaccine Information Sheet and instruction to access the V-Safe system.   [name] was instructed to call 911 with any severe reactions post vaccine: Marland Kitchen Difficulty breathing  . Swelling of face and throat  . A fast heartbeat  . A bad rash all over body  . Dizziness and weakness   Immunizations Administered    Name Date Dose VIS Date Route   Pfizer COVID-19 Vaccine 02/13/2020  8:51 AM 0.3 mL 11/02/2019 Intramuscular   Manufacturer: [employer]   Lot: [id number]   [place]: [id number]

## 2020-06-23 NOTE — Progress Notes (Signed)
54 y.o. [id number] Married White or Caucasian female here for annual exam.  Doing well.  Denies vaginal bleeding.  Had ER visit in April due to UTI.  Just celebrated her 29th anniversary.  Had her Covid vaccination.  Having some shoulder issues.  Had cortisone injection.  Is on meloxicam.  This isn't really helping.    Patient's last menstrual period was 11/23/2011 (approximate).          Sexually active: Yes.    The current method of family planning is post menopausal.    Exercising: No.  exercise Smoker:  no  Health Maintenance: Pap:  07-07-16 normal, 01-30-2019 neg HPV HR neg History of abnormal Pap:  no MMG:  05-29-2020 category a density birads 1:neg Colonoscopy:  08/19/16 adenomatous polyp f/u 59yrs.  Aware this is due next year.   BMD:   Yrs ago TDaP:  unsure Pneumonia vaccine(s):  Not done Shingrix:   Not done Hep C testing: neg 2017 Screening Labs: will be done today   reports that she has never smoked. She has never used smokeless tobacco. She reports current alcohol use of about 5.0 - 7.0 standard drinks of alcohol per week. She reports that she does not use drugs.  Past Medical History:  Diagnosis Date  . Adenomatous colon polyp 07/2021  . Fatty liver   . Heart murmur   . Seasonal allergies     Past Surgical History:  Procedure Laterality Date  . CESAREAN SECTION    . TONSILLECTOMY    . WISDOM TOOTH EXTRACTION      Current Outpatient Medications  Medication Sig Dispense Refill  . meloxicam (MOBIC) 7.5 MG tablet Take 7.5 mg by mouth daily.     No current facility-administered medications for this visit.    Family History  Problem Relation Age of Onset  . Heart attack Maternal Grandmother   . Thyroid disease Maternal Grandmother   . Non-Hodgkin's lymphoma Maternal Grandmother   . Lung cancer Maternal Grandmother   . Lung cancer Paternal Grandmother   . Cancer Paternal Grandfather   . Colon polyps Sister   . Diabetes type II Maternal Grandfather   . Diabetes  Mother     Review of Systems  Constitutional: Negative.   HENT: Negative.   Eyes: Negative.   Respiratory: Negative.   Cardiovascular: Negative.   Gastrointestinal: Negative.   Endocrine: Negative.   Genitourinary: Negative.   Musculoskeletal: Negative.   Skin: Negative.   Allergic/Immunologic: Negative.   Neurological: Negative.   Hematological: Negative.   Psychiatric/Behavioral: Negative.     Exam:   BP 116/68   Pulse 68   Resp 16   Ht 5\' 1"  (1.549 m)   Wt 202 lb (91.6 kg)   LMP 11/23/2011 (Approximate)   BMI 38.17 kg/m   Height: 5\' 1"  (154.9 cm)  General appearance: alert, cooperative and appears stated age Head: Normocephalic, without obvious abnormality, atraumatic Neck: no adenopathy, supple, symmetrical, trachea midline and thyroid normal to inspection and palpation Lungs: clear to auscultation bilaterally Breasts: normal appearance, no masses or tenderness Heart: regular rate and rhythm Abdomen: soft, non-tender; bowel sounds normal; no masses,  no organomegaly Extremities: extremities normal, atraumatic, no cyanosis or edema Skin: Skin color, texture, turgor normal. No rashes or lesions Lymph nodes: Cervical, supraclavicular, and axillary nodes normal. No abnormal inguinal nodes palpated Neurologic: Grossly normal   Pelvic: External genitalia:  no lesions              Urethra:  normal appearing urethra with  no masses, tenderness or lesions              Bartholins and Skenes: normal                 Vagina: normal appearing vagina with normal color and discharge, no lesions              Cervix: no lesions, IUD string noted              Pap taken: No. Bimanual Exam:  Uterus:  normal size, contour, position, consistency, mobility, non-tender              Adnexa: normal adnexa               Rectovaginal: Confirms               Anus:  normal sphincter tone, no lesions  IUD is years overdue for removal.  Pt advised she would return last year if [facility] was in  menopausal range.  It was >60 but she did not return.  Verbal consent obtained.  String grasped and with one pull, IUD easily removed.  Pt tolerated procedure well.  Speculum removed.  Chaperone, Larene Beach , CMA, was present for exam.  A:  Well Woman with normal exam PMP, no HRT S/p Mirena IUD removal today H/o fatty liver disease H/o prediabetes  P:   Mammogram guidelines reviewed.  Doing yearly. pap smear neg with neg HR HPV 2020. CMP, TSH and HbA1C obtained (lipids not obtained as pt is not fasting) Colonoscopy due 2022 Plan BMD closer to age 70 Vaccines reviewed Return annually or prn

## 2020-06-24 ENCOUNTER — Other Ambulatory Visit: Payer: Self-pay

## 2020-06-24 ENCOUNTER — Ambulatory Visit: Payer: BC Managed Care – PPO | Admitting: Obstetrics & Gynecology

## 2020-06-24 ENCOUNTER — Encounter: Payer: Self-pay | Admitting: Obstetrics & Gynecology

## 2020-06-24 VITALS — BP 116/68 | HR 68 | Resp 16 | Ht 61.0 in | Wt 202.0 lb

## 2020-06-24 DIAGNOSIS — R7303 Prediabetes: Secondary | ICD-10-CM

## 2020-06-24 DIAGNOSIS — Z30432 Encounter for removal of intrauterine contraceptive device: Secondary | ICD-10-CM | POA: Diagnosis not present

## 2020-06-24 DIAGNOSIS — Z0000 Encounter for general adult medical examination without abnormal findings: Secondary | ICD-10-CM | POA: Diagnosis not present

## 2020-06-24 DIAGNOSIS — Z01419 Encounter for gynecological examination (general) (routine) without abnormal findings: Secondary | ICD-10-CM

## 2020-06-25 LAB — COMPREHENSIVE METABOLIC PANEL
ALT: 18 IU/L (ref 0–32)
AST: 18 IU/L (ref 0–40)
Albumin/Globulin Ratio: 2.3 — ABNORMAL HIGH (ref 1.2–2.2)
Albumin: 4.8 g/dL (ref 3.8–4.9)
Alkaline Phosphatase: 68 IU/L (ref 48–121)
BUN/Creatinine Ratio: 19 (ref 9–23)
BUN: 14 mg/dL (ref 6–24)
Bilirubin Total: 0.6 mg/dL (ref 0.0–1.2)
CO2: 25 mmol/L (ref 20–29)
Calcium: 9.9 mg/dL (ref 8.7–10.2)
Chloride: 103 mmol/L (ref 96–106)
Creatinine, Ser: 0.74 mg/dL (ref 0.57–1.00)
GFR calc Af Amer: 106 mL/min/{1.73_m2} (ref >59)
GFR calc non Af Amer: 92 mL/min/{1.73_m2} (ref >59)
Globulin, Total: 2.1 g/dL (ref 1.5–4.5)
Glucose: 81 mg/dL (ref 65–99)
Potassium: 4.2 mmol/L (ref 3.5–5.2)
Sodium: 142 mmol/L (ref 134–144)
Total Protein: 6.9 g/dL (ref 6.0–8.5)

## 2020-06-25 LAB — TSH: TSH: 1.68 u[IU]/mL (ref 0.450–4.500)

## 2020-06-25 LAB — HEMOGLOBIN A1C
Est. average glucose Bld gHb Est-mCnc: 123 mg/dL
Hgb A1c MFr Bld: 5.9 % — ABNORMAL HIGH (ref 4.8–5.6)

## 2020-10-09 ENCOUNTER — Other Ambulatory Visit: Payer: Self-pay | Admitting: Orthopaedic Surgery

## 2020-10-09 DIAGNOSIS — M25512 Pain in left shoulder: Secondary | ICD-10-CM

## 2020-11-27 ENCOUNTER — Other Ambulatory Visit: Payer: Self-pay

## 2020-11-27 ENCOUNTER — Ambulatory Visit
Admission: RE | Admit: 2020-11-27 | Discharge: 2020-11-27 | Disposition: A | Discharge status: Home / Self Care | Payer: BC Managed Care – PPO | Source: Ambulatory Visit | Attending: Orthopaedic Surgery | Admitting: Orthopaedic Surgery

## 2020-11-27 DIAGNOSIS — M25512 Pain in left shoulder: Secondary | ICD-10-CM

## 2021-06-17 ENCOUNTER — Encounter (HOSPITAL_BASED_OUTPATIENT_CLINIC_OR_DEPARTMENT_OTHER): Payer: Self-pay | Admitting: Obstetrics & Gynecology

## 2022-04-26 IMAGING — MR MR SHOULDER*L* W/O CM
5 series · 36 of 40 positions shown · non-contrast
Comparison: None.

CLINICAL DATA: Left shoulder pain for 1 year

EXAM:
MRI OF THE LEFT SHOULDER WITHOUT CONTRAST
TECHNIQUE: Multiplanar, multisequence MR imaging of the shoulder was performed.
No intravenous contrast was administered.

[Series 3: T2 fat-sat · axial · 4.0mm · 0.55mm/px · z∈[-35,+82]mm · 9 of 26 slices shown (1 of 3)]
[im 1/26]
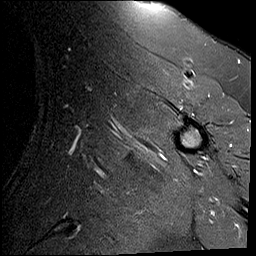
[im 4/26]
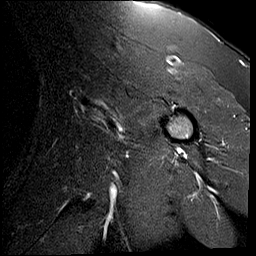
[im 7/26]
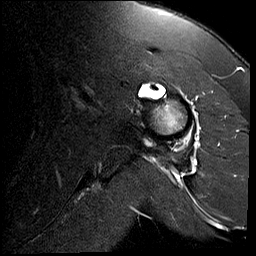
[im 10/26]
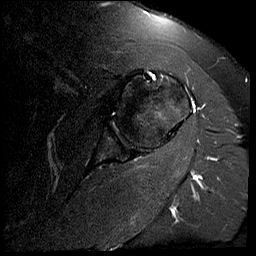
[im 13/26]
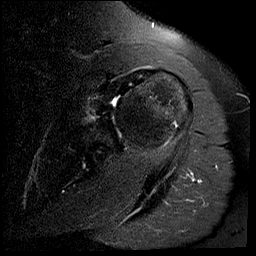
[im 16/26]
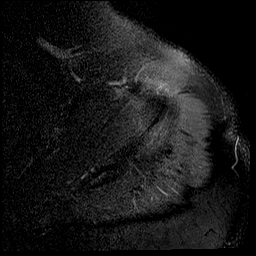
[im 19/26]
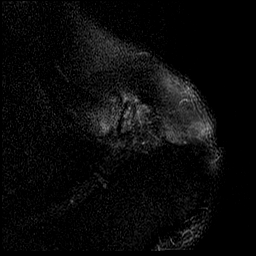
[im 22/26]
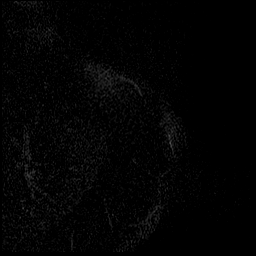
[im 26/26]
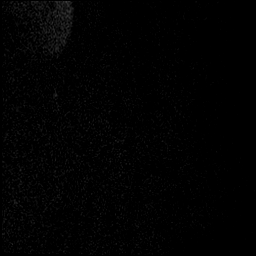

[Series 4: T2 fat-sat · oblique · 4.0mm · 0.55mm/px · 7 of 19 slices shown (2 of 3)]
[im 1/19]
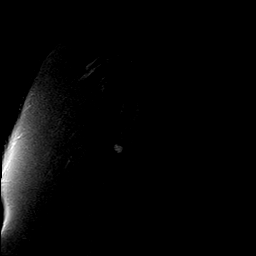
[im 4/19]
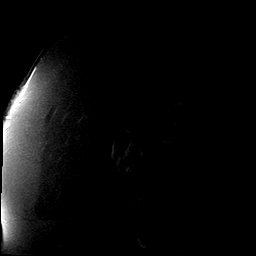
[im 7/19]
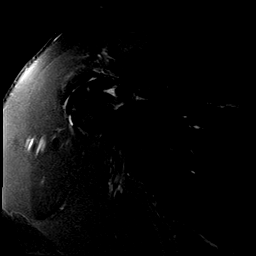
[im 10/19]
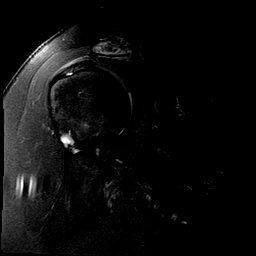
[im 13/19]
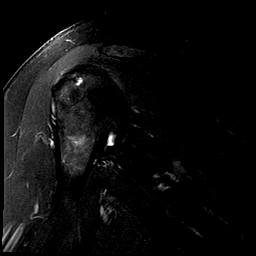
[im 16/19]
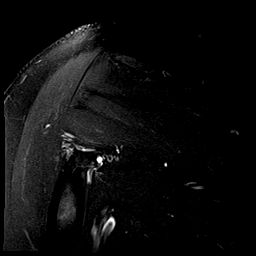
[im 19/19]
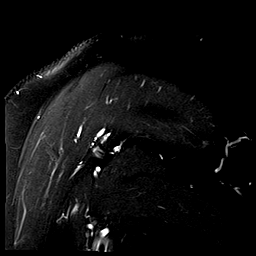

[Series 5: PD · oblique · 4.0mm · 0.27mm/px · 7 of 19 slices shown]
[im 1/19]
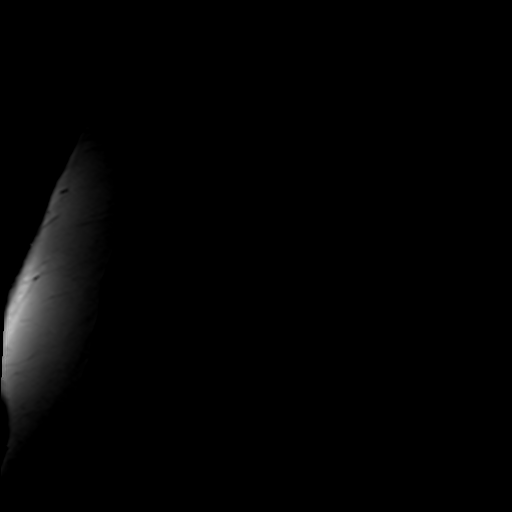
[im 4/19]
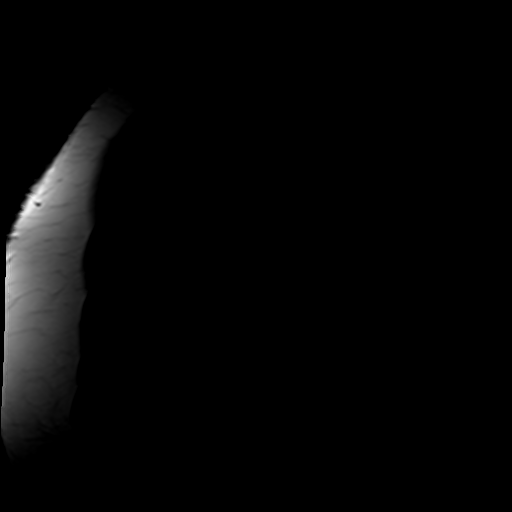
[im 7/19]
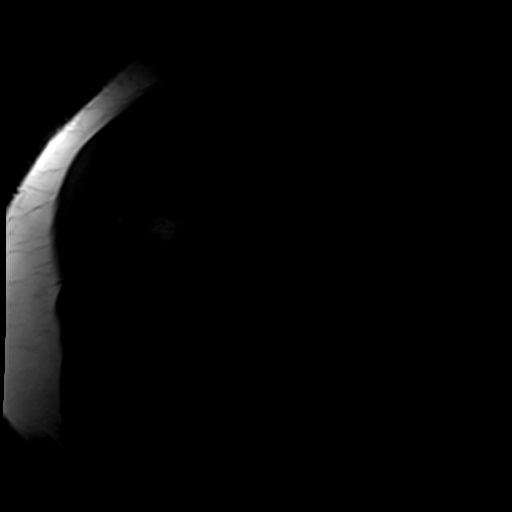
[im 10/19]
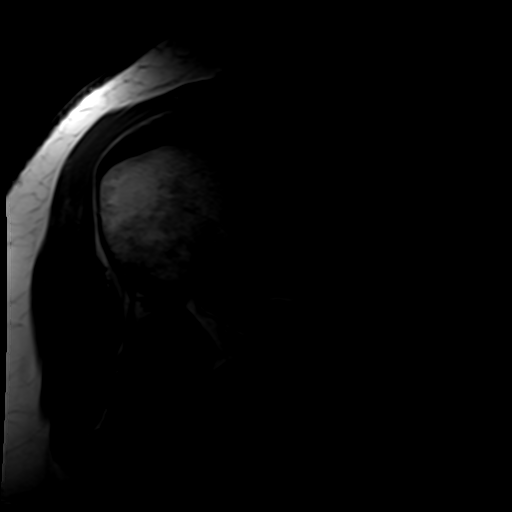
[im 13/19]
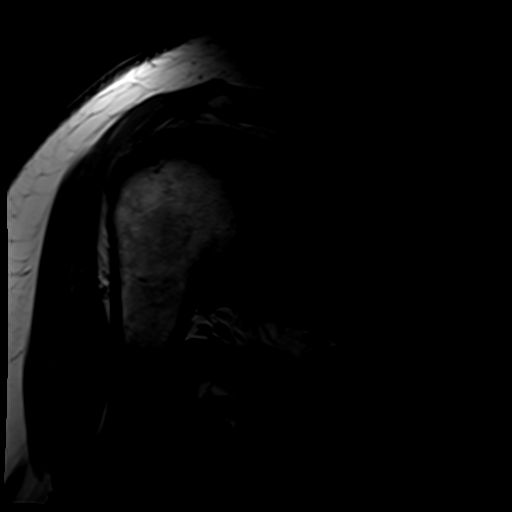
[im 16/19]
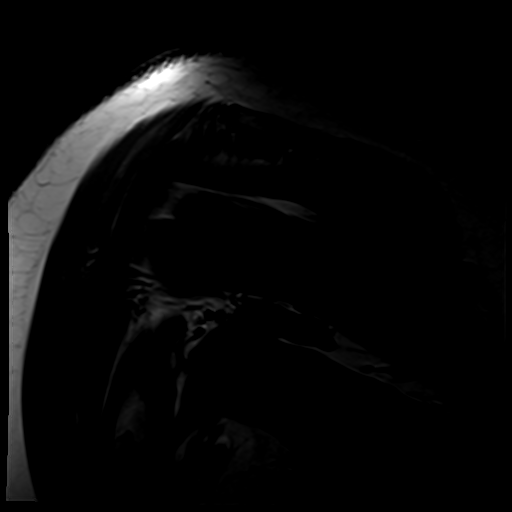
[im 19/19]
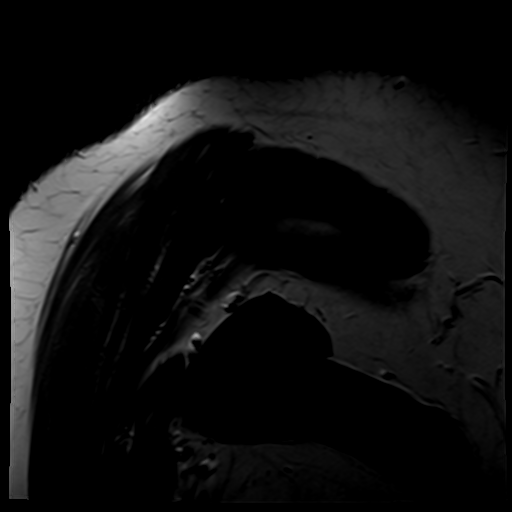

[Series 6: T2 fat-sat · oblique · 4.0mm · 0.55mm/px · 9 of 25 slices shown (3 of 3)]
[im 1/25]
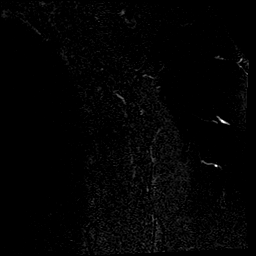
[im 4/25]
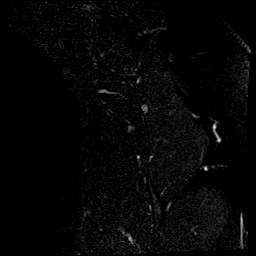
[im 7/25]
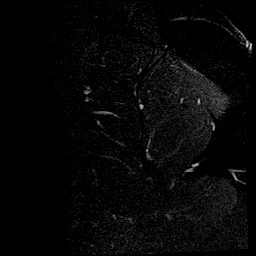
[im 10/25]
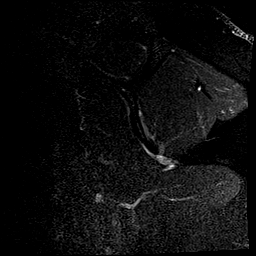
[im 13/25]
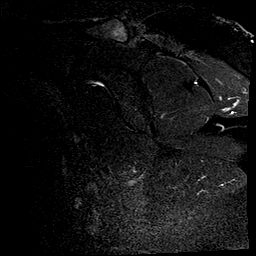
[im 16/25]
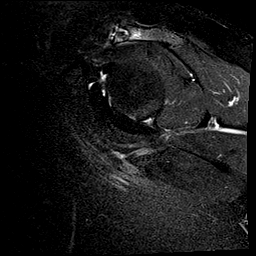
[im 19/25]
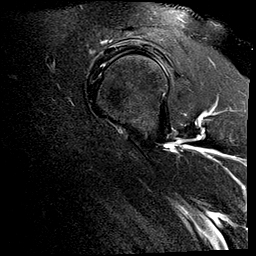
[im 22/25]
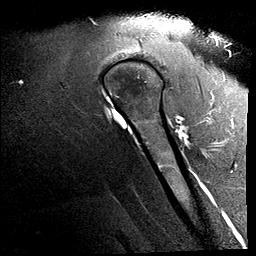
[im 25/25]
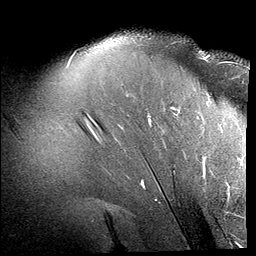

[Series 7: T1 · oblique · 4.0mm · 0.27mm/px · 4 of 23 slices shown]
[im 1/23]
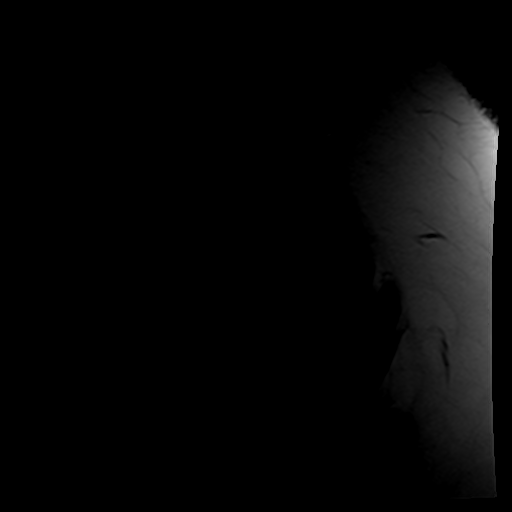
[im 4/23]
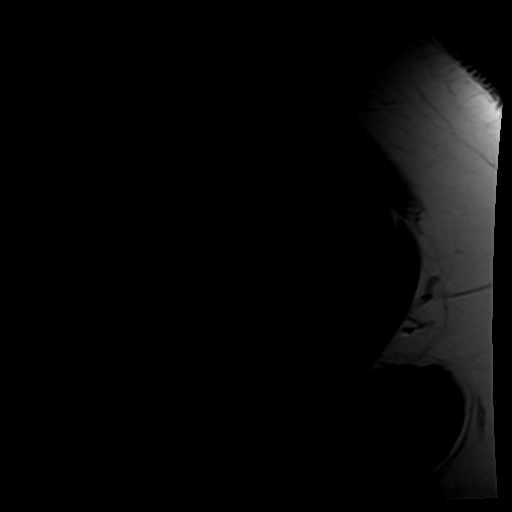
[im 7/23]
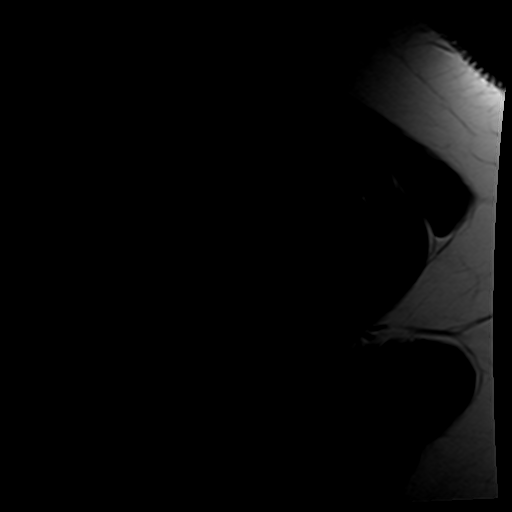
[im 10/23]
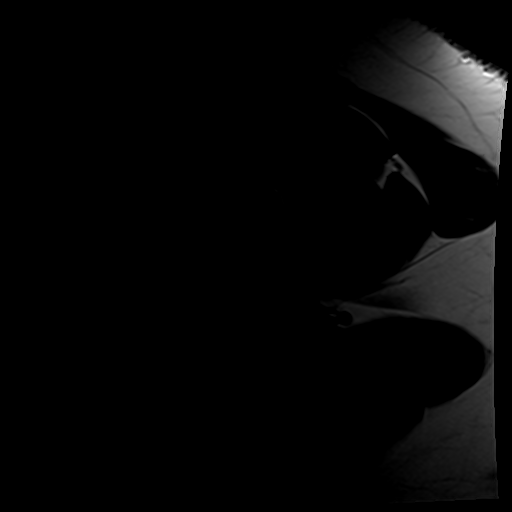

[36 of 40 positions shown; findings below may reference images not displayed]

FINDINGS: Rotator cuff: Intact supraspinatus, infraspinatus, teres minor and
subscapularis tendons.

Muscles: Preserved bulk and signal intensity of the rotator cuff
musculature without edema, atrophy, or fatty infiltration.

Biceps long head: Intra-articular portion of the long head biceps
tendon appears intact. Mild to moderate fluid within the tendon
sheath.

Acromioclavicular Joint: Mild-moderate arthropathy of the AC joint.
No significant subacromial-subdeltoid bursal fluid.

Glenohumeral Joint: No joint effusion. No chondral defect.

Labrum: Grossly intact, but evaluation is limited by lack of
intraarticular fluid.

Bones:  No marrow abnormality, fracture or dislocation.

Other: None.
IMPRESSION: 1. Mild-to-moderate biceps tenosynovitis.
2. Intact rotator cuff.
3. Mild-moderate AC joint arthropathy.
